# Patient Record
Sex: Female | Born: 1940 | Race: Black or African American | Hispanic: No | Marital: Married | Smoking: Never smoker
Health system: Southern US, Community
[De-identification: ages and names within clinical notes are randomized; demographics above are authoritative.]

## PROBLEM LIST (undated history)

## (undated) DIAGNOSIS — I10 Essential (primary) hypertension: Secondary | ICD-10-CM

---

## 2016-06-24 ENCOUNTER — Emergency Department (HOSPITAL_COMMUNITY)
Admission: EM | Admit: 2016-06-24 | Discharge: 2016-06-24 | Disposition: A | Discharge status: Home / Self Care | Payer: Medicare Other | Attending: Emergency Medicine | Admitting: Emergency Medicine

## 2016-06-24 ENCOUNTER — Encounter (HOSPITAL_COMMUNITY): Payer: Self-pay | Admitting: Emergency Medicine

## 2016-06-24 ENCOUNTER — Emergency Department (HOSPITAL_COMMUNITY): Payer: Medicare Other

## 2016-06-24 DIAGNOSIS — S161XXA Strain of muscle, fascia and tendon at neck level, initial encounter: Secondary | ICD-10-CM | POA: Diagnosis not present

## 2016-06-24 DIAGNOSIS — Y999 Unspecified external cause status: Secondary | ICD-10-CM | POA: Diagnosis not present

## 2016-06-24 DIAGNOSIS — M542 Cervicalgia: Secondary | ICD-10-CM | POA: Diagnosis present

## 2016-06-24 DIAGNOSIS — Y939 Activity, unspecified: Secondary | ICD-10-CM | POA: Diagnosis not present

## 2016-06-24 DIAGNOSIS — I10 Essential (primary) hypertension: Secondary | ICD-10-CM | POA: Diagnosis not present

## 2016-06-24 DIAGNOSIS — Y92410 Unspecified street and highway as the place of occurrence of the external cause: Secondary | ICD-10-CM | POA: Diagnosis not present

## 2016-06-24 HISTORY — DX: Essential (primary) hypertension: I10

## 2016-06-24 MED ORDER — NAPROXEN 375 MG PO TABS: 375.0000 mg | ORAL_TABLET | Freq: Two times a day (BID) | ORAL | 0 refills | Status: AC

## 2016-06-24 MED ORDER — CYCLOBENZAPRINE HCL 5 MG PO TABS: 5.0000 mg | ORAL_TABLET | Freq: Two times a day (BID) | ORAL | 0 refills | Status: AC | PRN

## 2016-06-24 MED ORDER — CYCLOBENZAPRINE HCL 10 MG PO TABS
5.0000 mg | ORAL_TABLET | Freq: Once | ORAL | Status: AC
  Administered 2016-06-24: 5 mg via ORAL
  Filled 2016-06-24: qty 1

## 2016-06-24 NOTE — ED Notes (Signed)
Patient transported to CT 
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
## 2016-06-24 NOTE — Discharge Instructions (Signed)
The muscle relaxant will make you sleepy so do not take it if you are driving or doing any activity that may cause injury. Follow up with your doctor or return here for any problems.
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
